# Patient Record
Sex: Female | Born: 1985 | Race: White | Hispanic: No | Marital: Married | Smoking: Never smoker
Health system: Southern US, Community
[De-identification: ages and names within clinical notes are randomized; demographics above are authoritative.]

## PROBLEM LIST (undated history)

## (undated) DIAGNOSIS — Z87442 Personal history of urinary calculi: Secondary | ICD-10-CM

## (undated) DIAGNOSIS — I10 Essential (primary) hypertension: Secondary | ICD-10-CM

## (undated) HISTORY — PX: DILATION AND CURETTAGE OF UTERUS: SHX78

## (undated) HISTORY — PX: LITHOTRIPSY: SUR834

---

## 2013-02-05 ENCOUNTER — Emergency Department (INDEPENDENT_AMBULATORY_CARE_PROVIDER_SITE_OTHER): Payer: BC Managed Care – PPO

## 2013-02-05 ENCOUNTER — Emergency Department
Admission: EM | Admit: 2013-02-05 | Discharge: 2013-02-05 | Disposition: A | Discharge status: Home / Self Care | Payer: BC Managed Care – PPO | Source: Home / Self Care | Attending: Family Medicine | Admitting: Family Medicine

## 2013-02-05 DIAGNOSIS — M549 Dorsalgia, unspecified: Secondary | ICD-10-CM

## 2013-02-05 DIAGNOSIS — R350 Frequency of micturition: Secondary | ICD-10-CM

## 2013-02-05 DIAGNOSIS — Z87442 Personal history of urinary calculi: Secondary | ICD-10-CM | POA: Insufficient documentation

## 2013-02-05 HISTORY — DX: Essential (primary) hypertension: I10

## 2013-02-05 HISTORY — DX: Personal history of urinary calculi: Z87.442

## 2013-02-05 LAB — POCT URINALYSIS DIP (MANUAL ENTRY)
Glucose, UA: NEGATIVE
Spec Grav, UA: 1.015 (ref 1.005–1.03)
Urobilinogen, UA: 0.2 (ref 0–1)
pH, UA: 6 (ref 5–8)

## 2013-02-05 MED ORDER — CYCLOBENZAPRINE HCL 10 MG PO TABS: 10.0000 mg | ORAL_TABLET | Freq: Three times a day (TID) | ORAL | Status: DC | PRN

## 2013-02-05 MED ORDER — KETOROLAC TROMETHAMINE 30 MG/ML IJ SOLN
30.0000 mg | Freq: Once | INTRAMUSCULAR | Status: AC
  Administered 2013-02-05: 30 mg via INTRAMUSCULAR

## 2013-02-05 MED ORDER — PREDNISONE 50 MG PO TABS: ORAL_TABLET | ORAL | Status: DC

## 2013-02-05 MED ORDER — FLUCONAZOLE 150 MG PO TABS: 150.0000 mg | ORAL_TABLET | Freq: Once | ORAL | Status: DC

## 2013-02-05 MED ORDER — CEPHALEXIN 500 MG PO CAPS: 500.0000 mg | ORAL_CAPSULE | Freq: Three times a day (TID) | ORAL | Status: DC

## 2013-02-05 NOTE — ED Provider Notes (Signed)
History    CSN: [id number] Arrival date & time 02/05/13  1305  First MD Initiated Contact with Patient 02/05/13 1338     Chief Complaint  Patient presents with  . Back Pain    x 1 week  . Urinary Frequency    couple weeks    Patient is a 27 y.o. female presenting with back pain and frequency.  Back Pain Location:  Lumbar spine Quality:  Aching and stabbing Radiates to: mild intermittent radiation into buttocks. Pain severity:  Moderate Worse during: intermittent. Worse with certain positions with sitting and standing. Duration:  2 weeks Timing:  Intermittent Progression:  Unchanged Chronicity:  Recurrent Context: physical stress   Relieved by:  Nothing Worsened by:  Ambulation and bending Associated symptoms comment:  Increased urinary frequency   Risk factors comment:  Prior hx/o kidney stones s/p ureteral stent placement and lithotripsy. Pt wants to rule out kidney stone as souce of sxs.  Urinary Frequency   Past Medical History  Diagnosis Date  . Hypertension   . History of kidney stones    Past Surgical History  Procedure Laterality Date  . Dilation and curettage of uterus     History reviewed. No pertinent family history. History  Substance Use Topics  . Smoking status: Never Smoker   . Smokeless tobacco: Never Used  . Alcohol Use: No   OB History   Grav Para Term Preterm Abortions TAB SAB Ect Mult Living                 Review of Systems  Genitourinary: Positive for frequency.  Musculoskeletal: Positive for back pain.  All other systems reviewed and are negative.    Allergies  Review of patient's allergies indicates no known allergies.  Home Medications  No current outpatient prescriptions on file. BP 127/79  Pulse 90  Temp(Src) 98.2 F (36.8 C) (Oral)  Ht 5\' 4"  (1.626 m)  Wt 159 lb (72.122 kg)  BMI 27.28 kg/m2  SpO2 99%  LMP 02/02/2013 Physical Exam  Constitutional: She appears well-developed and well-nourished.  HENT:  Head:  Normocephalic and atraumatic.  Eyes: Conjunctivae are normal. Pupils are equal, round, and reactive to light.  Neck: Normal range of motion.  Cardiovascular: Normal rate, regular rhythm and normal heart sounds.   Pulmonary/Chest: Effort normal.  Abdominal: Soft. Bowel sounds are normal.  Minimal suprapubic tenderness   Musculoskeletal:       Back:  Neurological: She is alert.  Skin: Skin is warm.    ED Course  Procedures (including critical care time) Labs Reviewed  POCT URINALYSIS DIP (MANUAL ENTRY) - Normal  URINE CULTURE    Dg Lumbar Spine Complete  02/05/2013   *RADIOLOGY REPORT*  Clinical Data: Back pain, urinary frequency  LUMBAR SPINE - COMPLETE 4+ VIEW  Comparison: None.  Findings: Five lumbar-type vertebral bodies.  Mild straightening of the lumbar spine.  No evidence of fracture or dislocation.  Vertebral body heights and intervertebral disc spaces are maintained.  Visualized bony pelvis appears intact.  IUD overlying the pelvis.  IMPRESSION: Normal lumbar spine radiographs.   Original Report Authenticated By: Charline Bills, M.D.   Dg Abd 1 View  02/05/2013   *RADIOLOGY REPORT*  Clinical Data: Back pain, urinary frequency  ABDOMEN - 1 VIEW  Comparison: None.  Findings: Nonobstructive bowel gas pattern.  IUD overlying the pelvis.  Visualized osseous structures are within normal limits.  IMPRESSION: Unremarkable abdominal radiograph.   Original Report Authenticated By: Charline Bills, M.D.   1.  Frequent urination   2. Back pain     MDM  DDx includes LS strain, UTI, kidney stones. L spine and KUB WNL.   Will place on keflex for UTI coverage.  Urine culture. Noted trace blood on UA, however pt menstruating.  Prednisone and flexeril for MSK component.  Discussed infectious, GU and MSK red flags.  Follow up as needed.     The patient and/or caregiver has been counseled thoroughly with regard to treatment plan and/or medications prescribed including dosage, schedule,  interactions, rationale for use, and possible side effects and they verbalize understanding. Diagnoses and expected course of recovery discussed and will return if not improved as expected or if the condition worsens. Patient and/or caregiver verbalized understanding.         Doree Albee, MD 02/05/13 1504

## 2013-02-05 NOTE — ED Notes (Signed)
Katie Rocha complains of low back pain for 1 week. She is also concerned about frequent urination for a couple of weeks.

## 2013-02-06 LAB — URINE CULTURE

## 2013-02-07 ENCOUNTER — Telehealth: Payer: Self-pay | Admitting: *Deleted

## 2013-09-12 ENCOUNTER — Telehealth: Payer: Self-pay | Admitting: *Deleted

## 2013-09-12 ENCOUNTER — Emergency Department (INDEPENDENT_AMBULATORY_CARE_PROVIDER_SITE_OTHER)
Admission: EM | Admit: 2013-09-12 | Discharge: 2013-09-12 | Disposition: A | Discharge status: Home / Self Care | Payer: BC Managed Care – PPO | Source: Home / Self Care | Attending: Family Medicine | Admitting: Family Medicine

## 2013-09-12 ENCOUNTER — Encounter: Payer: Self-pay | Admitting: Emergency Medicine

## 2013-09-12 DIAGNOSIS — M545 Low back pain, unspecified: Secondary | ICD-10-CM

## 2013-09-12 DIAGNOSIS — G8929 Other chronic pain: Secondary | ICD-10-CM

## 2013-09-12 DIAGNOSIS — Z87442 Personal history of urinary calculi: Secondary | ICD-10-CM

## 2013-09-12 LAB — POCT URINALYSIS DIP (MANUAL ENTRY)
Bilirubin, UA: NEGATIVE
Blood, UA: NEGATIVE
Glucose, UA: NEGATIVE
Ketones, POC UA: NEGATIVE
Leukocytes, UA: NEGATIVE
Nitrite, UA: NEGATIVE
PH UA: 7 (ref 5–8)
PROTEIN UA: NEGATIVE
SPEC GRAV UA: 1.015 (ref 1.005–1.03)
UROBILINOGEN UA: 0.2 (ref 0–1)

## 2013-09-12 MED ORDER — MELOXICAM 15 MG PO TABS: 15.0000 mg | ORAL_TABLET | Freq: Every day | ORAL | Status: DC

## 2013-09-12 MED ORDER — METAXALONE 800 MG PO TABS: ORAL_TABLET | ORAL | Status: DC

## 2013-09-12 NOTE — ED Notes (Signed)
Katie Rocha reports chronic lower back pain, 1 year, that has progressively become worse. Numbness spreads to her buttocks. She was unable to be seen by PCP today.

## 2013-09-12 NOTE — ED Notes (Signed)
Per Dr. Rolla PlateBeese's request, apt made for f/u with Dr. Benjamin Stainhekkekandam 09/19/13 @ 11:15am. Apt time and location given while in office.

## 2013-09-12 NOTE — Discharge Instructions (Signed)
Begin back exercises.   Chronic Back Pain  When back pain lasts longer than 3 months, it is called chronic back pain.People with chronic back pain often go through certain periods that are more intense (flare-ups).  CAUSES Chronic back pain can be caused by wear and tear (degeneration) on different structures in your back. These structures include:  The bones of your spine (vertebrae) and the joints surrounding your spinal cord and nerve roots (facets).  The strong, fibrous tissues that connect your vertebrae (ligaments). Degeneration of these structures may result in pressure on your nerves. This can lead to constant pain. HOME CARE INSTRUCTIONS  Avoid bending, heavy lifting, prolonged sitting, and activities which make the problem worse.  Take brief periods of rest throughout the day to reduce your pain. Lying down or standing usually is better than sitting while you are resting.  Take over-the-counter or prescription medicines only as directed by your caregiver. SEEK IMMEDIATE MEDICAL CARE IF:   You have weakness or numbness in one of your legs or feet.  You have trouble controlling your bladder or bowels.  You have nausea, vomiting, abdominal pain, shortness of breath, or fainting. Document Released: 08/14/2004 Document Revised: 09/29/2011 Document Reviewed: 06/21/2011 [facility] Patient Information 2014 [place], [place].

## 2013-09-12 NOTE — ED Provider Notes (Signed)
CSN: [id number]     Arrival date & time 09/12/13  1127 History   First MD Initiated Contact with Patient 09/12/13 1148     Chief Complaint  Patient presents with  . Back Pain       HPI Comments: Patient complains of chronic lower left back pain for at least a year.  Over the past week the pain has extended into her mid-lower back, worse at night. The pain is worse when bending over, and better supine in a fetal position.  The pain radiates to her buttocks at times, described as "numbness."  No bowel or bladder dysfunction.  No saddle numbness.  She has had poor response in the past from Naproxen and prednisone.   She had a normal lumbar spine x-ray last year.  Patient is a 28 y.o. female presenting with back pain. The history is provided by the patient.  Back Pain Location:  Sacro-iliac joint and lumbar spine Quality:  Aching Radiates to: radiates to buttocks. Pain severity:  Mild Pain is:  Worse during the night Onset quality:  Gradual Duration:  12 months Timing:  Constant Progression:  Unchanged Chronicity:  Chronic Relieved by:  Nothing Worsened by:  Bending Ineffective treatments:  NSAIDs Associated symptoms: tingling   Associated symptoms: no abdominal pain, no abdominal swelling, no bladder incontinence, no bowel incontinence, no chest pain, no dysuria, no fever, no headaches, no leg pain, no numbness, no paresthesias, no pelvic pain, no perianal numbness, no weakness and no weight loss     Past Medical History  Diagnosis Date  . Hypertension   . History of kidney stones    Past Surgical History  Procedure Laterality Date  . Dilation and curettage of uterus    . Lithotripsy     History reviewed. No pertinent family history. History  Substance Use Topics  . Smoking status: Never Smoker   . Smokeless tobacco: Never Used  . Alcohol Use: No   OB History   Grav Para Term Preterm Abortions TAB SAB Ect Mult Living                 Review of Systems  Constitutional:  Negative for fever and weight loss.  Cardiovascular: Negative for chest pain.  Gastrointestinal: Negative for abdominal pain and bowel incontinence.  Genitourinary: Negative for bladder incontinence, dysuria and pelvic pain.  Musculoskeletal: Positive for back pain.  Neurological: Positive for tingling. Negative for weakness, numbness, headaches and paresthesias.  All other systems reviewed and are negative.      Allergies  Review of patient's allergies indicates no known allergies.  Home Medications   Current Outpatient Rx  Name  Route  Sig  Dispense  Refill  . meloxicam (MOBIC) 15 MG tablet   Oral   Take 1 tablet (15 mg total) by mouth daily. Take with food each morning   15 tablet   0   . metaxalone (SKELAXIN) 800 MG tablet      Take one tab by mouth, one to three times daily   21 tablet   1    BP 148/87  Pulse 92  Resp 14  Wt 165 lb (74.844 kg)  SpO2 100%  LMP 08/29/2013 Physical Exam Nursing notes and Vital Signs reviewed. Appearance:  Patient appears healthy, stated age, and in no acute distress Eyes:  Pupils are equal, round, and reactive to light and accomodation.  Extraocular movement is intact.  Conjunctivae are not inflamed  Pharynx:  Normal Neck:  Supple.   No adenopathy  Lungs:  Clear to auscultation.  Breath sounds are equal.  Heart:  Regular rate and rhythm without murmurs, rubs, or gallops.  Abdomen:  Nontender without masses or hepatosplenomegaly.  Bowel sounds are present.  No CVA or flank tenderness.  Extremities:  No edema.  No calf tenderness Skin:  No rash present.  Back:  Good range of motion.  Can heel/toe walk and squat without difficulty. Tenderness over both SI joints, left worse than right, with tenderness extending into left buttock.  Straight leg raising test is negative.  Sitting knee extension test is negative.  Strength and sensation in the lower extremities is normal.  Patellar and achilles reflexes are normal.  Right leg approximately  1.5cm longer than left leg by inspection. No tenderness over hip greater trochanters.   ED Course  Procedures  none    Labs Reviewed  POCT URINALYSIS DIP (MANUAL ENTRY):  Negative         MDM   Final diagnoses:  Chronic low back pain  History of kidney stones    Begin Mobic and Skelaxin. Begin back exercises ([facility] information and instruction handout given)  Followup with Dr. Rodney Langtonhomas Thekkekandam in one week    Lattie HawStephen A Beese, MD 09/14/13 [id number]

## 2013-09-19 ENCOUNTER — Institutional Professional Consult (permissible substitution): Payer: BC Managed Care – PPO | Admitting: Sports Medicine

## 2013-09-22 ENCOUNTER — Encounter: Payer: Self-pay | Admitting: Sports Medicine

## 2013-09-22 ENCOUNTER — Ambulatory Visit (INDEPENDENT_AMBULATORY_CARE_PROVIDER_SITE_OTHER): Payer: BC Managed Care – PPO | Admitting: Sports Medicine

## 2013-09-22 VITALS — BP 124/78 | HR 72 | Ht 64.0 in | Wt 165.0 lb

## 2013-09-22 DIAGNOSIS — M5136 Other intervertebral disc degeneration, lumbar region: Secondary | ICD-10-CM | POA: Insufficient documentation

## 2013-09-22 DIAGNOSIS — M51369 Other intervertebral disc degeneration, lumbar region without mention of lumbar back pain or lower extremity pain: Secondary | ICD-10-CM | POA: Insufficient documentation

## 2013-09-22 DIAGNOSIS — M5137 Other intervertebral disc degeneration, lumbosacral region: Secondary | ICD-10-CM

## 2013-09-22 DIAGNOSIS — M51379 Other intervertebral disc degeneration, lumbosacral region without mention of lumbar back pain or lower extremity pain: Secondary | ICD-10-CM

## 2013-09-22 MED ORDER — TRAMADOL-ACETAMINOPHEN 37.5-325 MG PO TABS: 1.0000 | ORAL_TABLET | Freq: Four times a day (QID) | ORAL | Status: DC | PRN

## 2013-09-22 MED ORDER — PREDNISONE 50 MG PO TABS: ORAL_TABLET | ORAL | Status: DC

## 2013-09-22 NOTE — Progress Notes (Signed)
   Subjective:    I'm seeing this patient as a consultation for:  Dr. Cathren HarshBeese  CC: Low back pain  HPI: This is a very pleasant 28 year old female, for the past several months she's had pain that she localizes in her low back, worse with Valsalva, driving a car, and forward flexion. She denies any trauma, no bowel or bladder dysfunction or saddle numbness. Pain is moderate, persistent. She did have a CT scan which showed a L5-S1 disc protrusion. Symptoms are predominantly axial.  Past medical history, Surgical history, Family history not pertinant except as noted below, Social history, Allergies, and medications have been entered into the medical record, reviewed, and no changes needed.   Review of Systems: No headache, visual changes, nausea, vomiting, diarrhea, constipation, dizziness, abdominal pain, skin rash, fevers, chills, night sweats, weight loss, swollen lymph nodes, body aches, joint swelling, muscle aches, chest pain, shortness of breath, mood changes, visual or auditory hallucinations.   Objective:   General: Well Developed, well nourished, and in no acute distress.  Neuro/Psych: Alert and oriented x3, extra-ocular muscles intact, able to move all 4 extremities, sensation grossly intact. Skin: Warm and dry, no rashes noted.  Respiratory: Not using accessory muscles, speaking in full sentences, trachea midline.  Cardiovascular: Pulses palpable, no extremity edema. Abdomen: Does not appear distended. Back Exam:  Inspection: Unremarkable  Motion: Flexion 45 deg, Extension 45 deg, Side Bending to 45 deg bilaterally,  Rotation to 45 deg bilaterally  SLR laying: Negative  XSLR laying: Negative  Palpable tenderness: None. FABER: negative. Sensory change: Gross sensation intact to all lumbar and sacral dermatomes.  Reflexes: 2+ at both patellar tendons, 2+ at achilles tendons, Babinski's downgoing.  Strength at foot  Plantar-flexion: 5/5 Dorsi-flexion: 5/5 Eversion: 5/5 Inversion:  5/5  Leg strength  Quad: 5/5 Hamstring: 5/5 Hip flexor: 5/5 Hip abductors: 5/5  Gait unremarkable.  CT scan does show a L5-S1 disc protrusion.  Impression and Recommendations:   This case required medical decision making of moderate complexity.

## 2013-09-22 NOTE — Assessment & Plan Note (Signed)
Back pain is predominantly axial, CT scan for trauma did show an L5-S1 disc occlusion. At this we are going to proceed with an additional 5 days of prednisone, Ultracet, and formal physical therapy. Return to see me in one month, if no we will obtain an MRI for interventional injection planning.

## 2013-10-03 ENCOUNTER — Other Ambulatory Visit: Payer: Self-pay | Admitting: Sports Medicine

## 2013-10-03 MED ORDER — MELOXICAM 15 MG PO TABS: ORAL_TABLET | ORAL | Status: DC

## 2013-12-21 ENCOUNTER — Ambulatory Visit (INDEPENDENT_AMBULATORY_CARE_PROVIDER_SITE_OTHER): Payer: BC Managed Care – PPO | Admitting: Sports Medicine

## 2013-12-21 ENCOUNTER — Ambulatory Visit (INDEPENDENT_AMBULATORY_CARE_PROVIDER_SITE_OTHER): Payer: BC Managed Care – PPO

## 2013-12-21 ENCOUNTER — Encounter: Payer: Self-pay | Admitting: Sports Medicine

## 2013-12-21 VITALS — BP 127/81 | HR 85

## 2013-12-21 DIAGNOSIS — M7989 Other specified soft tissue disorders: Secondary | ICD-10-CM

## 2013-12-21 DIAGNOSIS — X58XXXA Exposure to other specified factors, initial encounter: Secondary | ICD-10-CM

## 2013-12-21 DIAGNOSIS — S92309A Fracture of unspecified metatarsal bone(s), unspecified foot, initial encounter for closed fracture: Secondary | ICD-10-CM

## 2013-12-21 DIAGNOSIS — M5136 Other intervertebral disc degeneration, lumbar region: Secondary | ICD-10-CM

## 2013-12-21 DIAGNOSIS — M51369 Other intervertebral disc degeneration, lumbar region without mention of lumbar back pain or lower extremity pain: Secondary | ICD-10-CM

## 2013-12-21 DIAGNOSIS — M79671 Pain in right foot: Secondary | ICD-10-CM

## 2013-12-21 DIAGNOSIS — S92353A Displaced fracture of fifth metatarsal bone, unspecified foot, initial encounter for closed fracture: Secondary | ICD-10-CM | POA: Insufficient documentation

## 2013-12-21 MED ORDER — HYDROCODONE-ACETAMINOPHEN 5-325 MG PO TABS: 1.0000 | ORAL_TABLET | Freq: Three times a day (TID) | ORAL | Status: DC | PRN

## 2013-12-21 NOTE — Assessment & Plan Note (Addendum)
Palpation over the dorsum of the fourth and fifth metatarsal shafts with bruising suggestive of a fracture. Cast boot, hydrocodone, x-rays. Return in 2 weeks. Xray before visit  I billed a fracture code for this visit, all subsequent visits for this complaint will be "post-op checks" in the global period.

## 2013-12-21 NOTE — Progress Notes (Signed)
  Subjective:    CC: Right foot pain  HPI: This is a pleasant 28 year old female who recently inverted her foot, she has pain, with swelling and bruising over the dorsum of the fourth and fifth metatarsals. Pain is severe, persistent.  Past medical history, Surgical history, Family history not pertinant except as noted below, Social history, Allergies, and medications have been entered into the medical record, reviewed, and no changes needed.   Review of Systems: No fevers, chills, night sweats, weight loss, chest pain, or shortness of breath.   Objective:    General: Well Developed, well nourished, and in no acute distress.  Neuro: Alert and oriented x3, extra-ocular muscles intact, sensation grossly intact.  HEENT: Normocephalic, atraumatic, pupils equal round reactive to light, neck supple, no masses, no lymphadenopathy, thyroid nonpalpable.  Skin: Warm and dry, no rashes. Cardiac: Regular rate and rhythm, no murmurs rubs or gallops, no lower extremity edema.  Respiratory: Clear to auscultation bilaterally. Not using accessory muscles, speaking in full sentences. Left Foot: Significantly bruised and swollen. Range of motion is full in all directions. Strength is 5/5 in all directions. No hallux valgus. No pes cavus or pes planus. No abnormal callus noted. No pain over the navicular prominence, or base of fifth metatarsal. No tenderness to palpation of the calcaneal insertion of plantar fascia. No pain at the Achilles insertion. No pain over the calcaneal bursa. No pain of the retrocalcaneal bursa. Tenderness to palpation over the dorsum of the fourth and fifth metatarsal shafts. No hallux rigidus or limitus. No tenderness palpation over interphalangeal joints. No pain with compression of the metatarsal heads. Neurovascularly intact distally.  X-rays reviewed, there is oblique fracture through the neck of fifth metatarsal.  Impression and Recommendations:

## 2013-12-21 NOTE — Addendum Note (Signed)
Addended by: Monica Becton on: 12/21/2013 12:19 PM   Modules accepted: Orders

## 2013-12-21 NOTE — Assessment & Plan Note (Signed)
Resolved with prednisone. 

## 2014-01-03 ENCOUNTER — Encounter: Payer: Self-pay | Admitting: Sports Medicine

## 2014-01-03 ENCOUNTER — Ambulatory Visit (INDEPENDENT_AMBULATORY_CARE_PROVIDER_SITE_OTHER): Payer: BC Managed Care – PPO | Admitting: Sports Medicine

## 2014-01-03 ENCOUNTER — Ambulatory Visit (INDEPENDENT_AMBULATORY_CARE_PROVIDER_SITE_OTHER): Payer: BC Managed Care – PPO

## 2014-01-03 VITALS — BP 111/76 | HR 76 | Ht 64.0 in | Wt 163.0 lb

## 2014-01-03 DIAGNOSIS — S92353A Displaced fracture of fifth metatarsal bone, unspecified foot, initial encounter for closed fracture: Secondary | ICD-10-CM

## 2014-01-03 DIAGNOSIS — IMO0001 Reserved for inherently not codable concepts without codable children: Secondary | ICD-10-CM

## 2014-01-03 DIAGNOSIS — S92309A Fracture of unspecified metatarsal bone(s), unspecified foot, initial encounter for closed fracture: Secondary | ICD-10-CM

## 2014-01-03 DIAGNOSIS — M79671 Pain in right foot: Secondary | ICD-10-CM

## 2014-01-03 MED ORDER — HYDROCODONE-ACETAMINOPHEN 5-325 MG PO TABS: 1.0000 | ORAL_TABLET | Freq: Three times a day (TID) | ORAL | Status: DC | PRN

## 2014-01-03 NOTE — Progress Notes (Signed)
  Subjective: 2 weeks post spiral fracture through the neck of the right fifth metatarsal, doing well in cast boot   Objective: General: Well-developed, well-nourished, and in no acute distress. Right foot: Still tender to palpation of the fracture. Neurovascularly intact distally.  X-rays reviewed, shows stability of the spiral fracture.  Assessment/plan:

## 2014-01-03 NOTE — Assessment & Plan Note (Signed)
Continue boot for another 2 weeks. X-rays at the next visit. Refilling hydrocodone. Strap with compressive dressing.

## 2014-01-17 ENCOUNTER — Encounter: Payer: Self-pay | Admitting: Sports Medicine

## 2014-01-17 ENCOUNTER — Ambulatory Visit (INDEPENDENT_AMBULATORY_CARE_PROVIDER_SITE_OTHER): Payer: BC Managed Care – PPO | Admitting: Sports Medicine

## 2014-01-17 ENCOUNTER — Ambulatory Visit (INDEPENDENT_AMBULATORY_CARE_PROVIDER_SITE_OTHER): Payer: BC Managed Care – PPO

## 2014-01-17 VITALS — BP 123/82 | HR 80 | Ht 64.0 in | Wt 163.0 lb

## 2014-01-17 DIAGNOSIS — IMO0001 Reserved for inherently not codable concepts without codable children: Secondary | ICD-10-CM

## 2014-01-17 DIAGNOSIS — S8290XD Unspecified fracture of unspecified lower leg, subsequent encounter for closed fracture with routine healing: Secondary | ICD-10-CM

## 2014-01-17 DIAGNOSIS — S92351D Displaced fracture of fifth metatarsal bone, right foot, subsequent encounter for fracture with routine healing: Secondary | ICD-10-CM

## 2014-01-17 DIAGNOSIS — S92353A Displaced fracture of fifth metatarsal bone, unspecified foot, initial encounter for closed fracture: Secondary | ICD-10-CM

## 2014-01-17 MED ORDER — HYDROCODONE-ACETAMINOPHEN 5-325 MG PO TABS: 1.0000 | ORAL_TABLET | Freq: Three times a day (TID) | ORAL | Status: DC | PRN

## 2014-01-17 NOTE — Assessment & Plan Note (Signed)
Continue Cam Boot for an additional week, transition into a regular shoe afterwards. Return to see me after that.

## 2014-01-17 NOTE — Progress Notes (Signed)
  Subjective: 4 weeks post viral fracture through the neck of the fifth metatarsal, doing well.   Objective: General: Well-developed, well-nourished, and in no acute distress. Right foot: Only mild tenderness to palpation of the fracture site. Neurovascularly intact distally.  Was again strapped with compressive dressing.  X-ray showed stability of the fracture with a small amount of bone callus formation suggesting early healing.  Assessment/plan:

## 2014-01-31 ENCOUNTER — Ambulatory Visit: Payer: BC Managed Care – PPO | Admitting: Sports Medicine

## 2014-05-17 ENCOUNTER — Ambulatory Visit (INDEPENDENT_AMBULATORY_CARE_PROVIDER_SITE_OTHER): Payer: BC Managed Care – PPO | Admitting: Physician Assistant

## 2014-05-17 ENCOUNTER — Encounter (INDEPENDENT_AMBULATORY_CARE_PROVIDER_SITE_OTHER): Payer: Self-pay

## 2014-05-17 ENCOUNTER — Encounter: Payer: Self-pay | Admitting: Physician Assistant

## 2014-05-17 VITALS — BP 132/90 | HR 86 | Ht 64.0 in | Wt 171.0 lb

## 2014-05-17 DIAGNOSIS — R350 Frequency of micturition: Secondary | ICD-10-CM | POA: Diagnosis not present

## 2014-05-17 DIAGNOSIS — F411 Generalized anxiety disorder: Secondary | ICD-10-CM | POA: Diagnosis not present

## 2014-05-17 DIAGNOSIS — R5382 Chronic fatigue, unspecified: Secondary | ICD-10-CM

## 2014-05-17 DIAGNOSIS — R631 Polydipsia: Secondary | ICD-10-CM

## 2014-05-17 DIAGNOSIS — R110 Nausea: Secondary | ICD-10-CM | POA: Diagnosis not present

## 2014-05-17 DIAGNOSIS — R002 Palpitations: Secondary | ICD-10-CM

## 2014-05-17 LAB — POCT URINALYSIS DIPSTICK
Bilirubin, UA: NEGATIVE
Glucose, UA: NEGATIVE
KETONES UA: NEGATIVE
LEUKOCYTES UA: NEGATIVE
Nitrite, UA: NEGATIVE
PH UA: 7
PROTEIN UA: NEGATIVE
RBC UA: NEGATIVE
Spec Grav, UA: 1.01
Urobilinogen, UA: 0.2

## 2014-05-17 LAB — POCT GLYCOSYLATED HEMOGLOBIN (HGB A1C): Hemoglobin A1C: 5.1

## 2014-05-17 LAB — POCT URINE PREGNANCY: Preg Test, Ur: NEGATIVE

## 2014-05-17 MED ORDER — CITALOPRAM HYDROBROMIDE 10 MG PO TABS: 10.0000 mg | ORAL_TABLET | Freq: Every day | ORAL | Status: DC

## 2014-05-17 MED ORDER — ALPRAZOLAM 0.5 MG PO TABS: 0.5000 mg | ORAL_TABLET | Freq: Two times a day (BID) | ORAL | Status: DC | PRN

## 2014-05-17 MED ORDER — AZELASTINE HCL 0.05 % OP SOLN: 1.0000 [drp] | Freq: Two times a day (BID) | OPHTHALMIC | Status: DC

## 2014-05-17 NOTE — Patient Instructions (Addendum)
Claritin daily for allergies.    Premature Ventricular Contraction Premature ventricular contraction (PVC) is an irregularity of the heart rhythm involving extra or skipped heartbeats. In some cases, they may occur without obvious cause or heart disease. Other times, they can be caused by an electrolyte change in the blood. These need to be corrected. They can also be seen when there is not enough oxygen going to the heart. A common cause of this is plaque or cholesterol buildup. This buildup decreases the blood supply to the heart. In addition, extra beats may be caused or aggravated by:  Excessive smoking.  Alcohol consumption.  Caffeine.  Certain medications  Some street drugs. SYMPTOMS   The sensation of feeling your heart skipping a beat (palpitations).  In many cases, the person may have no symptoms. SIGNS AND TESTS   A physical examination may show an occasional irregularity, but if the PVC beats do not happen often, they may not be found on physical exam.  Blood pressure is usually normal.  Other tests that may find extra beats of the heart are:  An EKG (electrocardiogram)  A Holter monitor which can monitor your heart over longer periods of time  An Angiogram (study of the heart arteries). TREATMENT  Usually extra heartbeats do not need treatment. The condition is treated only if symptoms are severe or if extra beats are very frequent or are causing problems. An underlying cause, if discovered, may also require treatment.  Treatment may also be needed if there may be a risk for other more serious cardiac arrhythmias.  PREVENTION   Moderation in caffeine, alcohol, and tobacco use may reduce the risk of ectopic heartbeats in some people.  Exercise often helps people who lead a sedentary (inactive) lifestyle. PROGNOSIS  PVC heartbeats are generally harmless and do not need treatment.  RISKS AND COMPLICATIONS   Ventricular tachycardia (occasionally).  There  usually are no complications.  Other arrhythmias (occasionally). SEEK IMMEDIATE MEDICAL CARE IF:   You feel palpitations that are frequent or continual.  You develop chest pain or other problems such as shortness of breath, sweating, or nausea and vomiting.  You become light-headed or faint (pass out).  You get worse or do not improve with treatment. Document Released: 02/22/2004 Document Revised: 09/29/2011 Document Reviewed: 09/03/2007 [facility] Patient Information 2015 [place]Care, [place]. This information is not intended to replace advice given to you by your health care provider. Make sure you discuss any questions you have with your health care provider.

## 2014-05-17 NOTE — Progress Notes (Signed)
Subjective:    Patient ID: Katie Rocha, female    DOB: 17-Sep-1985, 28 y.o.   MRN: [id number]  HPI  Pt is a 28 yo female who presents to the clinic to establish care.   .. Active Ambulatory Problems    Diagnosis Date Noted  . History of kidney stones   . Lumbar degenerative disc disease 09/22/2013  . Closed fracture of right fifth metatarsal bone 12/21/2013   Resolved Ambulatory Problems    Diagnosis Date Noted  . No Resolved Ambulatory Problems   Past Medical History  Diagnosis Date  . Hypertension    .Marland Kitchen. Family History  Problem Relation Age of Onset  . Hypertension Father   . Hyperlipidemia Maternal Grandmother   . Hyperlipidemia Maternal Grandfather    .Marland Kitchen. History   Social History  . Marital Status: Married    Spouse Name: N/A    Number of Children: N/A  . Years of Education: N/A   Occupational History  . Not on file.   Social History Main Topics  . Smoking status: Never Smoker   . Smokeless tobacco: Never Used  . Alcohol Use: Yes  . Drug Use: No  . Sexual Activity: Yes   Other Topics Concern  . Not on file   Social History Narrative   She comes in today with numerous symptoms over the past month or so. She is currently no on any prescription medications but taking MVI and Fish oils 4 days ago. She has been experiencing more palpitations recently every day but worse at night. Denies any CP. She drinks some caffiene but stopped 2 weeks ago to see if it would help. It has not. She is having occasionaly nausea but not vomiting. Has IUD. Denies any alcohol usage. Concerned about hypoglycemia or diabetes. She does feel thirsty all the time and goes to the bathroom with increased frequency. Denies any dysuria. She feels dizzy a lot. She is very fatigued. Some times her vision is blurry but usually when her eyes are watery and itchy. She admits to allergies. She is gaining weight. She does admit to being very stressed. Has taken xanax in the past and has helped acute  anxiety.        Review of Systems  All other systems reviewed and are negative.      Objective:   Physical Exam  Constitutional: She is oriented to person, place, and time. She appears well-developed and well-nourished.  HENT:  Head: Normocephalic and atraumatic.  Eyes:  Conjunctiva injected bilaterally with watery discharge.   Cardiovascular: Normal rate, regular rhythm and normal heart sounds.   Pulmonary/Chest: Effort normal and breath sounds normal. She has no wheezes.  No CVA tenderness.   Abdominal: Soft. Bowel sounds are normal. She exhibits no distension and no mass. There is no tenderness. There is no rebound and no guarding.  Neurological: She is alert and oriented to person, place, and time.  Psychiatric: She has a normal mood and affect. Her behavior is normal.          Assessment & Plan:  Nausea- UPT negative. Unexplained currently. Could be anxiety.   Urinary frequency/excessive thirst- UA dipstick was negative. A1C was 5.1. Pt does not have diabetes or infection. Discussed hypoglycemic events could still be occuring. Eating small frequent meals could help with any blood sugar lows.   Fatigue/palpitiations-  EKG NSR at 81. No ST elevation or depression. No arrhythmias. No PVC's. Discussed just because we did not catch a PVC does not mean  they are not happening. Gave pt HO on PVC and some common triggers. Keep diary when at there worse and follow up in 1 month. If not improving can get holter monitor for futher evaluation.  Lab work ordered for fatigue- CBC, TSH, iron levels, vitamin D and b12.   Anxiety- it is fairly evident by todays encounter that pt is very anxious. I would like for her to start celexa at bedtime and xanax just as needed up to twice a day for anxious attacks. Discussed xanax and abuse potential.SE of celexa discussed. If symptoms get worse call office. If not follow up in 4 weeks. Can take time for SSRI to work.

## 2014-05-19 ENCOUNTER — Ambulatory Visit (INDEPENDENT_AMBULATORY_CARE_PROVIDER_SITE_OTHER): Payer: BC Managed Care – PPO | Admitting: Physician Assistant

## 2014-05-19 ENCOUNTER — Other Ambulatory Visit: Payer: Self-pay | Admitting: Physician Assistant

## 2014-05-19 VITALS — BP 129/78 | HR 93 | Wt 171.0 lb

## 2014-05-19 DIAGNOSIS — Z136 Encounter for screening for cardiovascular disorders: Secondary | ICD-10-CM

## 2014-05-19 DIAGNOSIS — R030 Elevated blood-pressure reading, without diagnosis of hypertension: Secondary | ICD-10-CM | POA: Diagnosis not present

## 2014-05-19 DIAGNOSIS — Z0130 Encounter for examination of blood pressure without abnormal findings: Secondary | ICD-10-CM

## 2014-05-19 LAB — CBC WITH DIFFERENTIAL/PLATELET
Basophils Absolute: 0 10*3/uL (ref 0.0–0.1)
Basophils Relative: 0 % (ref 0–1)
EOS PCT: 1 % (ref 0–5)
Eosinophils Absolute: 0.1 10*3/uL (ref 0.0–0.7)
HEMATOCRIT: 42 % (ref 36.0–46.0)
HEMOGLOBIN: 14.6 g/dL (ref 12.0–15.0)
LYMPHS ABS: 1.9 10*3/uL (ref 0.7–4.0)
LYMPHS PCT: 21 % (ref 12–46)
MCH: 28.3 pg (ref 26.0–34.0)
MCHC: 34.8 g/dL (ref 30.0–36.0)
MCV: 81.6 fL (ref 78.0–100.0)
MONO ABS: 0.6 10*3/uL (ref 0.1–1.0)
MONOS PCT: 7 % (ref 3–12)
NEUTROS ABS: 6.5 10*3/uL (ref 1.7–7.7)
Neutrophils Relative %: 71 % (ref 43–77)
Platelets: 341 10*3/uL (ref 150–400)
RBC: 5.15 MIL/uL — AB (ref 3.87–5.11)
RDW: 12.8 % (ref 11.5–15.5)
WBC: 9.1 10*3/uL (ref 4.0–10.5)

## 2014-05-19 NOTE — Progress Notes (Addendum)
   Subjective:    Patient ID: Katie Rocha, female    DOB: 1985/09/14, 28 y.o.   MRN: [id number]  HPI  Wyatt Mageabitha is here for a blood pressure check. She reports elevated blood pressure last night at work. Right arm 157/94 and 154/96 Left arm 164/83. Today here blood pressure in our office is Left arm 129/78 and Right arm 119/73. She believes her blood pressure is the cause of her feeling bad. She has been on Atenolol in the past and thinks she should go back on the blood pressure medication.       Review of Systems     Objective:   Physical Exam        Assessment & Plan:  Call pt: certainly keep log of BP readings for next 2 weeks. There are many causes of BP elevation such as anxiety, increased intake of salt etc. Follow up in 2 weeks and can view log as well as symptoms. No reason today to start any BP medications. Tandy GawJade Breeback PA-C  Called patient: left detailed message advising patient to schedule a follow up in two weeks. Also to check and record blood pressure for the next two weeks.

## 2014-05-20 LAB — COMPLETE METABOLIC PANEL WITH GFR
ALBUMIN: 4.7 g/dL (ref 3.5–5.2)
ALT: 29 U/L (ref 0–35)
AST: 21 U/L (ref 0–37)
Alkaline Phosphatase: 52 U/L (ref 39–117)
BUN: 10 mg/dL (ref 6–23)
CALCIUM: 9.3 mg/dL (ref 8.4–10.5)
CHLORIDE: 105 meq/L (ref 96–112)
CO2: 21 meq/L (ref 19–32)
Creat: 0.63 mg/dL (ref 0.50–1.10)
GFR, Est African American: >89 mL/min
GLUCOSE: 82 mg/dL (ref 70–99)
POTASSIUM: 4.3 meq/L (ref 3.5–5.3)
SODIUM: 137 meq/L (ref 135–145)
TOTAL PROTEIN: 6.9 g/dL (ref 6.0–8.3)
Total Bilirubin: 0.7 mg/dL (ref 0.2–1.2)

## 2014-05-20 LAB — TSH: TSH: 1.748 u[IU]/mL (ref 0.350–4.500)

## 2014-05-20 LAB — VITAMIN D 25 HYDROXY (VIT D DEFICIENCY, FRACTURES): VIT D 25 HYDROXY: 36 ng/mL (ref 30–89)

## 2014-05-20 LAB — IRON AND TIBC
%SAT: 41 % (ref 20–55)
Iron: 164 ug/dL — ABNORMAL HIGH (ref 42–145)
TIBC: 401 ug/dL (ref 250–470)
UIBC: 237 ug/dL (ref 125–400)

## 2014-05-20 LAB — VITAMIN B12: Vitamin B-12: 441 pg/mL (ref 211–911)

## 2014-05-20 LAB — FERRITIN: Ferritin: 47 ng/mL (ref 10–291)

## 2014-05-21 DIAGNOSIS — F411 Generalized anxiety disorder: Secondary | ICD-10-CM | POA: Insufficient documentation

## 2014-05-21 DIAGNOSIS — R5382 Chronic fatigue, unspecified: Secondary | ICD-10-CM | POA: Insufficient documentation

## 2014-05-21 DIAGNOSIS — R002 Palpitations: Secondary | ICD-10-CM | POA: Insufficient documentation

## 2014-06-03 NOTE — Addendum Note (Signed)
Addended by: Chalmers CaterUTTLE, Alitzel Cookson H on: 06/03/2014 09:55 AM   Modules accepted: Orders

## 2014-06-23 ENCOUNTER — Other Ambulatory Visit: Payer: Self-pay | Admitting: Sports Medicine

## 2014-09-20 ENCOUNTER — Other Ambulatory Visit: Payer: Self-pay | Admitting: Physician Assistant

## 2014-09-20 ENCOUNTER — Ambulatory Visit (INDEPENDENT_AMBULATORY_CARE_PROVIDER_SITE_OTHER): Payer: BLUE CROSS/BLUE SHIELD | Admitting: Physician Assistant

## 2014-09-20 ENCOUNTER — Encounter: Payer: Self-pay | Admitting: Physician Assistant

## 2014-09-20 VITALS — BP 156/88 | HR 113 | Ht 64.0 in | Wt 173.0 lb

## 2014-09-20 DIAGNOSIS — F411 Generalized anxiety disorder: Secondary | ICD-10-CM

## 2014-09-20 DIAGNOSIS — F430 Acute stress reaction: Principal | ICD-10-CM

## 2014-09-20 DIAGNOSIS — F419 Anxiety disorder, unspecified: Secondary | ICD-10-CM

## 2014-09-20 MED ORDER — CITALOPRAM HYDROBROMIDE 20 MG PO TABS: 20.0000 mg | ORAL_TABLET | Freq: Every day | ORAL | Status: DC

## 2014-09-20 MED ORDER — ALPRAZOLAM 0.5 MG PO TABS: 0.5000 mg | ORAL_TABLET | Freq: Three times a day (TID) | ORAL | Status: DC | PRN

## 2014-09-20 NOTE — Progress Notes (Signed)
   Subjective:    Patient ID: Katie Rocha, female    DOB: Mar 30, 1986, 29 y.o.   MRN: [id number]  HPI  Patient is a 29 year old female who presents to the clinic with acute anxiety. She just found out on Friday that her husband has been cheating on her. She found pictures and text messages. She is extremely devastated. They have 3 his together. She is struggling to know what to do. She went to work yesterday and felt like she was going to have a breakdown. She did take some of the maxilla was previously given to her and it did help. She had been seen back in October for mild anxiety. She was given Celexa but never started it. She decided to start yesterday. She denies any suicidal or homicidal thoughts. She is currently not in counseling.   Review of Systems  All other systems reviewed and are negative.      Objective:   Physical Exam  Constitutional: She is oriented to person, place, and time. She appears well-developed and well-nourished.  HENT:  Head: Normocephalic and atraumatic.  Pulmonary/Chest: Effort normal and breath sounds normal. She has no wheezes.  Neurological: She is alert and oriented to person, place, and time.  Psychiatric:  Crying throughout encounter today.          Assessment & Plan:  Anxiety it as Q reaction to exceptional stress- GAD -7 was 21. discuss with patient that were more related. Discussed with patient how Celexa is not at its therapeutic dose in one day. Will take time to get her system. Discussed with patient could increase celexa to 20mg  after 7 days. I did give new rx of 20mg  daily. Increased xanax to .5mg  tid for short term. Discussed abuse potential and how needed to decreased after a month or so. I did encourage patient that we needed to proceed with counseling. She has an appointment downstairs for tomorrow. She is concerned about cost. Her insurance does not cover counseling. I offered her alternatives with some of the local churches in  counseling. She is interested. I do think this would help her. I did write her out of work for the next week while the Celexa gets in her system as well as she calms down and make some decisions.  Spent 30 minutes with patient greater than 50% of the visit spent counseling patient regarding acute stressful situation.

## 2014-09-21 ENCOUNTER — Ambulatory Visit (HOSPITAL_COMMUNITY): Payer: Self-pay | Admitting: Physician Assistant

## 2014-10-04 ENCOUNTER — Telehealth: Payer: Self-pay

## 2014-10-04 MED ORDER — SERTRALINE HCL 50 MG PO TABS: 50.0000 mg | ORAL_TABLET | Freq: Every day | ORAL | Status: DC

## 2014-10-04 NOTE — Telephone Encounter (Signed)
Katie Rocha states she may have side effects from the Celexa. She reports from the first day of taking the Celexa she noticed nausea, stomach burning and diarrhea. She did take Pepto-Bismol for the symptoms with a little relief of the burning but no relief from the diarrhea. She is happy to report some improvement in her anxiety/depression. She tested positive for chlamydia and was treated. She has an appointment today with her GYN for further testing. She wanted to know if she should start a PPI or H2 blocker for her symptoms or change medication altogether. Please advise. She is aware she will need to follow up in the near future.

## 2014-10-04 NOTE — Telephone Encounter (Signed)
Contacted Patient, advised to stop taking the Celexa Rx and we will call in a new Rx for Zoloft 50mg . Patient verbalized understanding. Sent Rx to patient preferred pharmacy. No further questions.  FYI: Patient wanted Katie Rocha to know she has an appointment tomorrow (10/05/2014) at [facility].

## 2014-10-04 NOTE — Telephone Encounter (Signed)
Let's try switching medication first. If side effect do not want to mask with other medications. Let's try zoloft 50mg  daily and stop celexa 20mg . Please add celexa to intolerances with GI side effects. Give one month but needs a follow up in next 2 weeks after starting.

## 2014-10-18 ENCOUNTER — Encounter: Payer: Self-pay | Admitting: Physician Assistant

## 2014-10-18 ENCOUNTER — Ambulatory Visit (INDEPENDENT_AMBULATORY_CARE_PROVIDER_SITE_OTHER): Payer: BLUE CROSS/BLUE SHIELD | Admitting: Physician Assistant

## 2014-10-18 VITALS — BP 136/89 | HR 86 | Wt 170.0 lb

## 2014-10-18 DIAGNOSIS — F419 Anxiety disorder, unspecified: Secondary | ICD-10-CM

## 2014-10-18 DIAGNOSIS — F329 Major depressive disorder, single episode, unspecified: Secondary | ICD-10-CM | POA: Diagnosis not present

## 2014-10-18 DIAGNOSIS — F32A Depression, unspecified: Secondary | ICD-10-CM | POA: Insufficient documentation

## 2014-10-18 DIAGNOSIS — F411 Generalized anxiety disorder: Secondary | ICD-10-CM

## 2014-10-18 DIAGNOSIS — F430 Acute stress reaction: Principal | ICD-10-CM

## 2014-10-18 MED ORDER — ALPRAZOLAM 0.5 MG PO TABS: 0.5000 mg | ORAL_TABLET | Freq: Three times a day (TID) | ORAL | Status: DC | PRN

## 2014-10-18 MED ORDER — ESCITALOPRAM OXALATE 10 MG PO TABS: 10.0000 mg | ORAL_TABLET | Freq: Every day | ORAL | Status: DC

## 2014-10-18 MED ORDER — BUSPIRONE HCL 5 MG PO TABS: 5.0000 mg | ORAL_TABLET | Freq: Three times a day (TID) | ORAL | Status: DC

## 2014-10-18 NOTE — Patient Instructions (Signed)
lexapro daily.  buspar twice a day up to three times.  xanax

## 2014-10-18 NOTE — Progress Notes (Signed)
   Subjective:    Patient ID: Katie Rocha, female    DOB: August 12, 1985, 29 y.o.   MRN: [id number]  HPI Pt presents to the clinic for acute anxiety follow up. Approximately 1 month ago her husband cheated on her. We started her on celexa. Within first week she had benefit but had lots of diarrhea and nausea. She was also on abx for STD however diarrhea was always after taking celexa. Xanax helps but having to take up to three times a day. We called in zoloft and been on for 1 week with absolutely no benefit. She is very tearful, anxious, worrying. She gets so anxious that she has vomited in the past. She thought her home life was better before stopping celexa. Now she feels like she has no tolerance for anyone. No suicidal or homicidal thoughts. She is going to counseling at [facility] which she feels is helping some.    Review of Systems  All other systems reviewed and are negative.      Objective:   Physical Exam  Constitutional: She is oriented to person, place, and time. She appears well-developed and well-nourished.  HENT:  Head: Normocephalic and atraumatic.  Cardiovascular: Normal rate, regular rhythm and normal heart sounds.   Pulmonary/Chest: Breath sounds normal.  Neurological: She is alert and oriented to person, place, and time.  Skin: Skin is dry.  Psychiatric: Her behavior is normal.  Very tearful.           Assessment & Plan:  Anxiety/depression- GAD-7 was 21. PHQ-9 was 20. She feels like numbers were much better with celexa. Discussed not on zoloft to really know how would react but since no improvement will change today. Stop zoloft. continue counseling. Refilled xanax but discussed to only use as needed and to try to cut back. Will start lexapro and see if she tolerates better. Discussed option of viibryd but she feels like she tried lexapro in the past and worked well. Will also add buspar bid up to tid. Follow up in one month.

## 2014-11-18 ENCOUNTER — Other Ambulatory Visit: Payer: Self-pay | Admitting: Physician Assistant

## 2014-12-20 ENCOUNTER — Other Ambulatory Visit: Payer: Self-pay | Admitting: Physician Assistant

## 2014-12-26 ENCOUNTER — Other Ambulatory Visit: Payer: Self-pay | Admitting: Family Medicine

## 2014-12-26 MED ORDER — ESCITALOPRAM OXALATE 10 MG PO TABS: 10.0000 mg | ORAL_TABLET | Freq: Every day | ORAL | Status: DC

## 2014-12-26 NOTE — Telephone Encounter (Signed)
Patient called in tears stating she needs a refill on her lexapro. Advised we need to set her up for an appt, added to Dr. Linford ArnoldMetheney schedule since Pt prefers a female provider. Will send over 2 week supply to last until f/u.

## 2015-01-05 ENCOUNTER — Ambulatory Visit (INDEPENDENT_AMBULATORY_CARE_PROVIDER_SITE_OTHER): Payer: BLUE CROSS/BLUE SHIELD | Admitting: Family Medicine

## 2015-01-05 ENCOUNTER — Encounter: Payer: Self-pay | Admitting: Family Medicine

## 2015-01-05 VITALS — BP 123/78 | HR 93 | Wt 170.0 lb

## 2015-01-05 DIAGNOSIS — F329 Major depressive disorder, single episode, unspecified: Secondary | ICD-10-CM | POA: Diagnosis not present

## 2015-01-05 DIAGNOSIS — F411 Generalized anxiety disorder: Secondary | ICD-10-CM | POA: Diagnosis not present

## 2015-01-05 DIAGNOSIS — M545 Low back pain, unspecified: Secondary | ICD-10-CM

## 2015-01-05 DIAGNOSIS — F32A Depression, unspecified: Secondary | ICD-10-CM

## 2015-01-05 MED ORDER — PREDNISONE 20 MG PO TABS: 40.0000 mg | ORAL_TABLET | Freq: Every day | ORAL | Status: DC

## 2015-01-05 MED ORDER — ESCITALOPRAM OXALATE 20 MG PO TABS: 20.0000 mg | ORAL_TABLET | Freq: Every day | ORAL | Status: DC

## 2015-01-05 NOTE — Progress Notes (Signed)
   Subjective:    Patient ID: Katie Rocha, female    DOB: [birth year], 29 y.o.   MRN: [id number]  HPI Three-month follow-up for anxiety-she was seen and switch to Lexapro. Previously on citalopram. Last gad 7 score was 21 and PHQ 9 score was 20. They had also discussed trying to start to decrease her Xanax. Has been trying to cut down and take a half. She does a lot of door checking. She feels like she may have some OCd.  Says her husband cheated on her and gave her an STD and that is what triggered a lot of this.   Though admits she has been a nervous person her whole life.   Back pain for 2 days. She was picking up laundry and "pulled a muscle". She has been taking ibuprofen and tramadol. She reports that prednisone helps with a flare up. Worse if she bends forward or if she turns just a certain way or rotates at her hips. No radiation of pain into the buttock area or down to the legs. No numbness or tingling.  Has been seeing a Veterinary surgeon at [employer]. That has been helpful as well.  Review of Systems     Objective:   Physical Exam  Constitutional: She appears well-nourished.  HENT:  Head: Normocephalic and atraumatic.  Musculoskeletal:  Normal lumbar flexion, extension, rotation right and left. Neg straigt leg raise bilaterally. Nontender of the lumbar spine or paraspinous muscles. Hip, knee and ankle strength is 5/5 bilaterally.  Patellar reflexes 2+ bilaterally.  Skin: Skin is warm and dry.  Psychiatric: She has a normal mood and affect.          Assessment & Plan:  Anxiety /acute depression-uncontrolled. Gad 7 score of 20 today and PHQ 9 score of 17. Overall she does feel like the medication has really helped her but just feels like she still has a ways to go. We discussed options. She would like to try increasing the Lexapro first before changing medications. I think this is reasonable but I don't know how much improvement will really make in her symptom control. I did  encourage her to continue seeing the counselor.  If were not seen effective results at follow-up in 4-6 weeks then will consider paxil as has worked for her Dad who has anxiety and OCD.    Low Back pain -given short course of prednisone since this does seem to alleviate her symptoms in the past. Work on gentle stretches, heating pad and if not improving over the next 2-3 weeks last no. She can restart her ibuprofen when she completes the prednisone. Make sure take with food and water.

## 2015-02-08 ENCOUNTER — Ambulatory Visit (INDEPENDENT_AMBULATORY_CARE_PROVIDER_SITE_OTHER): Payer: BLUE CROSS/BLUE SHIELD | Admitting: Family Medicine

## 2015-02-08 ENCOUNTER — Encounter: Payer: Self-pay | Admitting: Family Medicine

## 2015-02-08 VITALS — BP 131/83 | HR 81 | Wt 172.0 lb

## 2015-02-08 DIAGNOSIS — F329 Major depressive disorder, single episode, unspecified: Secondary | ICD-10-CM | POA: Diagnosis not present

## 2015-02-08 DIAGNOSIS — F32A Depression, unspecified: Secondary | ICD-10-CM

## 2015-02-08 MED ORDER — BUPROPION HCL ER (XL) 150 MG PO TB24: 150.0000 mg | ORAL_TABLET | Freq: Every day | ORAL | Status: DC

## 2015-02-08 MED ORDER — ESCITALOPRAM OXALATE 20 MG PO TABS: 20.0000 mg | ORAL_TABLET | Freq: Every day | ORAL | Status: DC

## 2015-02-08 MED ORDER — ALPRAZOLAM 0.5 MG PO TABS: 0.5000 mg | ORAL_TABLET | Freq: Every day | ORAL | Status: DC | PRN

## 2015-02-08 NOTE — Assessment & Plan Note (Signed)
Depression and anxiety not quite at goal on Lexapro. We will augment with Wellbutrin as patient is at max dose of Lexapro. Continue to wean Xanax. Return in 1-3 months.

## 2015-02-08 NOTE — Patient Instructions (Signed)
Thank you for coming in today. Continue Lexapro you're already at the max dose. Add Wellbutrin XR daily Using an ex sparingly. Attempt to wean this medicine soon. Continue therapy if he finds it helpful. Return in about 2 or 3 months for recheck Generalized Anxiety Disorder Generalized anxiety disorder (GAD) is a mental disorder. It interferes with life functions, including relationships, work, and school. GAD is different from normal anxiety, which everyone experiences at some point in their lives in response to specific life events and activities. Normal anxiety actually helps [place] prepare for and get through these life events and activities. Normal anxiety goes away after the event or activity is over.  GAD causes anxiety that is not necessarily related to specific events or activities. It also causes excess anxiety in proportion to specific events or activities. The anxiety associated with GAD is also difficult to control. GAD can vary from mild to severe. People with severe GAD can have intense waves of anxiety with physical symptoms (panic attacks).  SYMPTOMS The anxiety and worry associated with GAD are difficult to control. This anxiety and worry are related to many life events and activities and also occur more days than not for 6 months or longer. People with GAD also have three or more of the following symptoms (one or more in children):  Restlessness.   Fatigue.  Difficulty concentrating.   Irritability.  Muscle tension.  Difficulty sleeping or unsatisfying sleep. DIAGNOSIS GAD is diagnosed through an assessment by your health care provider. Your health care provider will ask you questions aboutyour mood,physical symptoms, and events in your life. Your health care provider may ask you about your medical history and use of alcohol or drugs, including prescription medicines. Your health care provider may also do a physical exam and blood tests. Certain medical conditions and the  use of certain substances can cause symptoms similar to those associated with GAD. Your health care provider may refer you to a mental health specialist for further evaluation. TREATMENT The following therapies are usually used to treat GAD:   Medication. Antidepressant medication usually is prescribed for long-term daily control. Antianxiety medicines may be added in severe cases, especially when panic attacks occur.   Talk therapy (psychotherapy). Certain types of talk therapy can be helpful in treating GAD by providing support, education, and guidance. A form of talk therapy called cognitive behavioral therapy can teach you healthy ways to think about and react to daily life events and activities.  Stress managementtechniques. These include yoga, meditation, and exercise and can be very helpful when they are practiced regularly. A mental health specialist can help determine which treatment is best for you. Some people see improvement with one therapy. However, other people require a combination of therapies. Document Released: 11/01/2012 Document Revised: 11/21/2013 Document Reviewed: 11/01/2012 [facility] Patient Information 2015 [place], [place]. This information is not intended to replace advice given to you by your health care provider. Make sure you discuss any questions you have with your health care provider.

## 2015-02-08 NOTE — Progress Notes (Signed)
Katie Rocha is a 29 y.o. female who presents to [name]  today for depression and anxiety. Patient presents to clinic today to follow-up depression and anxiety. She currently takes Lexapro 20 mg daily and Xanax once daily as needed. She has successfully weaned from 3 times a day Xanax. She feels better but not completely better. She denies any significant feelings of panic. No vomiting fevers chills nausea vomiting or diarrhea. No chest pains palpitations or shortness of breath.   Past Medical History  Diagnosis Date  . Hypertension   . History of kidney stones    Past Surgical History  Procedure Laterality Date  . Dilation and curettage of uterus    . Lithotripsy     History  Substance Use Topics  . Smoking status: Never Smoker   . Smokeless tobacco: Never Used  . Alcohol Use: Yes   ROS as above Medications: Current Outpatient Prescriptions  Medication Sig Dispense Refill  . ALPRAZolam (XANAX) 0.5 MG tablet Take 1 tablet (0.5 mg total) by mouth daily as needed for anxiety. 30 tablet 1  . escitalopram (LEXAPRO) 20 MG tablet Take 1 tablet (20 mg total) by mouth at bedtime. 30 tablet 3  . Multiple Vitamin (MULTIVITAMIN) tablet Take 1 tablet by mouth daily.    . Omega-3 Fatty Acids (FISH OIL PO) Take by mouth daily.    . valACYclovir (VALTREX) 1000 MG tablet Take 1,000 mg by mouth 2 (two) times daily.    Marland Kitchen buPROPion (WELLBUTRIN XL) 150 MG 24 hr tablet Take 1 tablet (150 mg total) by mouth daily. 30 tablet 2   No current facility-administered medications for this visit.   Allergies  Allergen Reactions  . Celexa [Citalopram Hydrobromide] Diarrhea    diarrhea     Exam:  BP 131/83 mmHg  Pulse 81  Wt 172 lb (78.019 kg) Gen: Well NAD HEENT: EOMI,  MMM no goiter Lungs: Normal work of breathing. CTABL Heart: RRR no MRG Abd: NABS, Soft. Nondistended, Nontender Exts: Brisk capillary refill, warm and well perfused.  Psych: Alert and oriented  normal affect and speech and thought process. No SI or HI. PHQ9 is 11, GAD 7 is 8  No results found for this or any previous visit (from the past 24 hour(s)). No results found.   Please see individual assessment and plan sections.

## 2015-02-18 ENCOUNTER — Emergency Department (INDEPENDENT_AMBULATORY_CARE_PROVIDER_SITE_OTHER)
Admission: EM | Admit: 2015-02-18 | Discharge: 2015-02-18 | Disposition: A | Discharge status: Home / Self Care | Payer: BLUE CROSS/BLUE SHIELD | Source: Home / Self Care | Attending: Family Medicine | Admitting: Family Medicine

## 2015-02-18 ENCOUNTER — Other Ambulatory Visit: Payer: Self-pay | Admitting: Sports Medicine

## 2015-02-18 ENCOUNTER — Emergency Department (INDEPENDENT_AMBULATORY_CARE_PROVIDER_SITE_OTHER): Payer: BLUE CROSS/BLUE SHIELD

## 2015-02-18 ENCOUNTER — Encounter: Payer: Self-pay | Admitting: Emergency Medicine

## 2015-02-18 DIAGNOSIS — M79662 Pain in left lower leg: Secondary | ICD-10-CM

## 2015-02-18 DIAGNOSIS — Y9352 Activity, horseback riding: Secondary | ICD-10-CM

## 2015-02-18 DIAGNOSIS — S82492A Other fracture of shaft of left fibula, initial encounter for closed fracture: Secondary | ICD-10-CM | POA: Diagnosis not present

## 2015-02-18 DIAGNOSIS — M2548 Effusion, other site: Secondary | ICD-10-CM | POA: Diagnosis not present

## 2015-02-18 DIAGNOSIS — S82402A Unspecified fracture of shaft of left fibula, initial encounter for closed fracture: Secondary | ICD-10-CM | POA: Diagnosis not present

## 2015-02-18 DIAGNOSIS — S82832A Other fracture of upper and lower end of left fibula, initial encounter for closed fracture: Secondary | ICD-10-CM | POA: Insufficient documentation

## 2015-02-18 DIAGNOSIS — W1789XA Other fall from one level to another, initial encounter: Secondary | ICD-10-CM

## 2015-02-18 MED ORDER — IBUPROFEN 800 MG PO TABS: 800.0000 mg | ORAL_TABLET | Freq: Three times a day (TID) | ORAL | Status: DC

## 2015-02-18 MED ORDER — OXYCODONE-ACETAMINOPHEN 5-325 MG PO TABS: 1.0000 | ORAL_TABLET | ORAL | Status: DC | PRN

## 2015-02-18 NOTE — ED Notes (Signed)
Pt c/o left knee pain from knee injury yesterday.  Pt was thrown from a horse while riding.  Cannot bare any weight on knee.

## 2015-02-18 NOTE — Discharge Instructions (Signed)
Percocet is a narcotic pain medication, do not combine these medications with others containing tylenol. While taking, do not drink alcohol, drive, or perform any other activities that requires focus while taking these medications.   Please be sure to be Non-weight bearing until you follow up with Dr. Benjamin Stain.

## 2015-02-18 NOTE — ED Provider Notes (Signed)
CSN: [id number]     Arrival date & time 02/18/15  1059 History   None    Chief Complaint  Patient presents with  . Knee Injury   (Consider location/radiation/quality/duration/timing/severity/associated sxs/prior Treatment) HPI Patient is a 29 year old female presenting to urgent care with complaints of severe left knee pain after falling off a horse yesterday.  Patient states she was bucked off horse into the air and landed on her hands and knees with majority of her weight, going onto her left knee.  Patient notes she does have an abrasion to her left knee.  Pain is achy, sore throbbing 9/10 at worst.  Unable to bear weight onto her knee.  Pain does radiate into her calf.  Patient reports intermittent numbness and tingling in her leg.  Patient states she has been using crutches to get around.  Denies prior injury to her left knee.  Denies hitting her head or loss of consciousness.  Patient also notes mild achiness to her left elbow but states she is able to move it completely without pain.  Denies any bruising or swelling to her left elbow.  No other injuries.  Past Medical History  Diagnosis Date  . Hypertension   . History of kidney stones    Past Surgical History  Procedure Laterality Date  . Dilation and curettage of uterus    . Lithotripsy     Family History  Problem Relation Age of Onset  . Hypertension Father   . Hyperlipidemia Maternal Grandmother   . Hyperlipidemia Maternal Grandfather    History  Substance Use Topics  . Smoking status: Never Smoker   . Smokeless tobacco: Never Used  . Alcohol Use: Yes   OB History    No data available     Review of Systems  Musculoskeletal: Positive for myalgias, joint swelling, arthralgias and gait problem. Negative for back pain, neck pain and neck stiffness.       Left knee and calf pain  Skin: Positive for wound. Negative for color change, pallor and rash.  Neurological: Positive for weakness ( Left leg due to pain) and  numbness.    Allergies  Celexa  Home Medications   Prior to Admission medications   Medication Sig Start Date End Date Taking? Authorizing Provider  ibuprofen (ADVIL,MOTRIN) 100 MG chewable tablet Chew by mouth every 8 (eight) hours as needed.   Yes Historical Provider, MD  ALPRAZolam Prudy Feeler) 0.5 MG tablet Take 1 tablet (0.5 mg total) by mouth daily as needed for anxiety. 02/08/15   Rodolph Bong, MD  buPROPion (WELLBUTRIN XL) 150 MG 24 hr tablet Take 1 tablet (150 mg total) by mouth daily. 02/08/15   Rodolph Bong, MD  escitalopram (LEXAPRO) 20 MG tablet Take 1 tablet (20 mg total) by mouth at bedtime. 02/08/15   Rodolph Bong, MD  ibuprofen (ADVIL,MOTRIN) 800 MG tablet Take 1 tablet (800 mg total) by mouth 3 (three) times daily. 02/18/15   Junius Finner, PA-C  Multiple Vitamin (MULTIVITAMIN) tablet Take 1 tablet by mouth daily.    Historical Provider, MD  Omega-3 Fatty Acids (FISH OIL PO) Take by mouth daily.    Historical Provider, MD  oxyCODONE-acetaminophen (PERCOCET/ROXICET) 5-325 MG per tablet Take 1-2 tablets by mouth every 4 (four) hours as needed for severe pain. 02/18/15   Junius Finner, PA-C  valACYclovir (VALTREX) 1000 MG tablet Take 1,000 mg by mouth 2 (two) times daily.    Historical Provider, MD   BP 131/85 mmHg  Pulse 80  Temp(Src) 98.4 F (36.9 C) (Oral)  Ht 5\' 4"  (1.626 m)  Wt 172 lb (78.019 kg)  BMI 29.51 kg/m2  SpO2 98%  LMP 02/15/2015 (Approximate) Physical Exam  Constitutional: She is oriented to person, place, and time. She appears well-developed and well-nourished.  HENT:  Head: Normocephalic and atraumatic.  Eyes: EOM are normal.  Neck: Normal range of motion.  Cardiovascular: Normal rate.   Pulses:      Radial pulses are 2+ on the left side.       Dorsalis pedis pulses are 2+ on the left side.       Posterior tibial pulses are 2+ on the left side.  Left foot: cap refill < 3 seconds  Pulmonary/Chest: Effort normal.  Musculoskeletal: She exhibits edema and  tenderness.  Left knee: no obvious deformity. Mild edema to Lateral aspect. Circumferential tenderness, worse in medial joint space. Slight decreased ROM with due to pain at extremes of flexion and extension. Unable to bear weight on Left leg. Left lower leg: tenderness to lateral aspect over calf. Compartment soft. Left hip and ankle: non-tender. FROM.  FROM all 5 toes on Left foot. 4/5 strength with plantarflexion and dorsiflexion.   Left elbow: no obvious deformity, mild tenderness to medial aspect. FROM, 5/5 strength. FROM Left shoulder and wrist w/o pain.  Neurological: She is alert and oriented to person, place, and time.  Sensation in tact. Patella reflex not checked due to severe pain of Left knee.  Skin: Skin is warm and dry.  Left knee, anterior aspect: superficial abrasion. No active bleeding.  Psychiatric: She has a normal mood and affect. Her behavior is normal.  Nursing note and vitals reviewed.   ED Course  Procedures (including critical care time) Labs Review Labs Reviewed - No data to display  Imaging Review Dg Tibia/fibula Left  02/18/2015   CLINICAL DATA:  Thrown from horse yesterday, now with pain and swelling involving the and lower leg with multiple abrasions.  EXAM: LEFT TIBIA AND FIBULA - 2 VIEW  COMPARISON:  Left knee radiographs-earlier same day  FINDINGS: No fracture or dislocation. Normal appearance of the ankle given obliquity and large field of view. Regional soft tissues appear normal. No radiopaque foreign body.  IMPRESSION: No fracture or radiopaque foreign body.   Electronically Signed   By: Simonne Come M.D.   On: 02/18/2015 12:00   Dg Knee Complete 4 Views Left  02/18/2015   CLINICAL DATA:  Thrown from horse yesterday. Pain and swelling entire knee.  EXAM: LEFT KNEE - COMPLETE 4+ VIEW  COMPARISON:  None.  FINDINGS: There is a fracture through the left fibular neck. There is a moderate joint effusion. No additional fracture. No subluxation or dislocation.   IMPRESSION: Left fibular neck fracture.  Moderate joint effusion.   Electronically Signed   By: Charlett Nose M.D.   On: 02/18/2015 11:59     MDM   1. Fibula fracture, left, closed, initial encounter   2. Fall from horse, initial encounter   3. Calf pain, left    Patient is a 29 year old female complaining of severe left knee and calf pain after falling from a horse yesterday.  Also complaining of mild left arm pain.  Left leg is neurovascularly intact, with increased pain on full extension and full flexion of left knee.  Tenderness to proximal lateral left lower leg. Plain films significant for left fibular neck fracture and moderate joint effusion.  Consulted with Dr. Benjamin Stain, sports medicine, who also reviewed the imaging.  Does not believe there is evidence of tibia plateau fracture.  No additional imaging indicated at this time.  No evidence of compartment syndrome.  Patient placed in a knee immobilizer.  Patient came with thrown crutches.  Advised patient to remain non-weightbearing until follow-up with Dr. Benjamin Stain in 1-2 weeks, when additional plain films will be performed.  Work note provided for patient. Rx: percocet (25 tabs) and ibuprofen  (21 tabs)  Encouraged rest, ice, and elevation.  Patient verbalized understanding and agreement with treatment plan.       Junius Finner, PA-C 02/18/15 1319

## 2015-02-21 MED ORDER — OXYCODONE-ACETAMINOPHEN 5-325 MG PO TABS: 1.0000 | ORAL_TABLET | Freq: Four times a day (QID) | ORAL | Status: DC | PRN

## 2015-02-23 ENCOUNTER — Ambulatory Visit (INDEPENDENT_AMBULATORY_CARE_PROVIDER_SITE_OTHER): Payer: BLUE CROSS/BLUE SHIELD | Admitting: Sports Medicine

## 2015-02-23 ENCOUNTER — Encounter: Payer: Self-pay | Admitting: Sports Medicine

## 2015-02-23 ENCOUNTER — Ambulatory Visit (INDEPENDENT_AMBULATORY_CARE_PROVIDER_SITE_OTHER): Payer: BLUE CROSS/BLUE SHIELD

## 2015-02-23 VITALS — BP 121/67 | HR 83 | Wt 186.0 lb

## 2015-02-23 DIAGNOSIS — S82832A Other fracture of upper and lower end of left fibula, initial encounter for closed fracture: Secondary | ICD-10-CM | POA: Diagnosis not present

## 2015-02-23 DIAGNOSIS — X58XXXD Exposure to other specified factors, subsequent encounter: Secondary | ICD-10-CM | POA: Diagnosis not present

## 2015-02-23 DIAGNOSIS — M25062 Hemarthrosis, left knee: Secondary | ICD-10-CM | POA: Diagnosis not present

## 2015-02-23 DIAGNOSIS — S72002D Fracture of unspecified part of neck of left femur, subsequent encounter for closed fracture with routine healing: Secondary | ICD-10-CM | POA: Diagnosis not present

## 2015-02-23 MED ORDER — HYDROCODONE-ACETAMINOPHEN 10-325 MG PO TABS: 1.0000 | ORAL_TABLET | ORAL | Status: DC | PRN

## 2015-02-23 NOTE — Assessment & Plan Note (Signed)
Aspiration of 36 mL of hemarthrosis, she does have some medial ligamentous laxity after the trauma. I'm going to obtain an MRI.

## 2015-02-23 NOTE — Assessment & Plan Note (Addendum)
Overall doing well, continuing immobilizer. Strap with compressive dressing. Aspiration of large hemarthrosis. Continue immobilizer for 2 more weeks, at the follow-up visit we will probably transition her to a hinged brace. Vicodin refilled. We did pull out a fairly significant hemarthrosis, considering medial knee pain and ligamentous instability I am going to also obtain an MRI  I billed a fracture code for this encounter, all subsequent visits will be post-op checks in the global period.

## 2015-02-23 NOTE — Progress Notes (Signed)
   Subjective:    I'm seeing this patient as a consultation for:  Junius Finner PA-C  CC: Left knee injury  HPI: Approximately 6 days ago this pleasant 29 year old female was stepped on by a horse, she had immediate pain, swelling, over both the lateral and medial aspect of her knee, she was seen in urgent care, placed in the knee immobilizer, x-ray showed a fracture of the proximal fibula, she did have some numbness and paresthesias over the anterolateral lower leg at that time. Overall the paresthesias have improved however are still minimally present. The pain over her lateral knee is minimal when compared to the pain over the medial knee. Severe, persistent.  Past medical history, Surgical history, Family history not pertinant except as noted below, Social history, Allergies, and medications have been entered into the medical record, reviewed, and no changes needed.   Review of Systems: No headache, visual changes, nausea, vomiting, diarrhea, constipation, dizziness, abdominal pain, skin rash, fevers, chills, night sweats, weight loss, swollen lymph nodes, body aches, joint swelling, muscle aches, chest pain, shortness of breath, mood changes, visual or auditory hallucinations.   Objective:   General: Well Developed, well nourished, and in no acute distress.  Neuro/Psych: Alert and oriented x3, extra-ocular muscles intact, able to move all 4 extremities, sensation grossly intact. Skin: Warm and dry, no rashes noted.  Respiratory: Not using accessory muscles, speaking in full sentences, trachea midline.  Cardiovascular: Pulses palpable, no extremity edema. Abdomen: Does not appear distended. Left knee: Visibly swollen with palpable effusion and fluid wave, tender to palpation over the fibular fracture, she also has significant tenderness to palpation over the medial joint line, MCL, with possible valgus laxity, I am unable to examine her anterior cruciate ligament or posterior cruciate  ligament due to guarding and pain. She does have good strength to dorsiflexion and eversion of her ankle, and sensation is intact over the common peroneal nerve distribution.  Procedure: Real-time Ultrasound Guided aspiration/Injection of left knee Device: GE Logiq E  Verbal informed consent obtained.  Time-out conducted.  Noted no overlying erythema, induration, or other signs of local infection.  Skin prepped in a sterile fashion.  Local anesthesia: Topical Ethyl chloride.  With sterile technique and under real time ultrasound guidance:  Aspirated 36 mL of frank blood, syringe switched and 3 mL lidocaine, 3 mL Marcaine injected easily Completed without difficulty  Pain immediately resolved suggesting accurate placement of the medication.  Advised to call if fevers/chills, erythema, induration, drainage, or persistent bleeding.  Images permanently stored and available for review in the ultrasound unit.  Impression: Technically successful ultrasound guided injection.  X-rays reviewed and show no appreciable change in the proximal fibular fracture  Impression and Recommendations:   This case required medical decision making of moderate complexity.

## 2015-02-26 ENCOUNTER — Ambulatory Visit (INDEPENDENT_AMBULATORY_CARE_PROVIDER_SITE_OTHER): Payer: BLUE CROSS/BLUE SHIELD

## 2015-02-26 DIAGNOSIS — S8332XD Tear of articular cartilage of left knee, current, subsequent encounter: Secondary | ICD-10-CM | POA: Diagnosis not present

## 2015-02-26 DIAGNOSIS — S72432D Displaced fracture of medial condyle of left femur, subsequent encounter for closed fracture with routine healing: Secondary | ICD-10-CM

## 2015-02-26 DIAGNOSIS — S82435D Nondisplaced oblique fracture of shaft of left fibula, subsequent encounter for closed fracture with routine healing: Secondary | ICD-10-CM

## 2015-02-26 DIAGNOSIS — S83412D Sprain of medial collateral ligament of left knee, subsequent encounter: Secondary | ICD-10-CM | POA: Diagnosis not present

## 2015-02-26 DIAGNOSIS — Y9352 Activity, horseback riding: Secondary | ICD-10-CM

## 2015-02-26 DIAGNOSIS — S83402D Sprain of unspecified collateral ligament of left knee, subsequent encounter: Secondary | ICD-10-CM

## 2015-02-26 DIAGNOSIS — M25062 Hemarthrosis, left knee: Secondary | ICD-10-CM

## 2015-02-27 ENCOUNTER — Other Ambulatory Visit: Payer: Self-pay | Admitting: Sports Medicine

## 2015-02-27 MED ORDER — OXYCODONE-ACETAMINOPHEN 10-325 MG PO TABS: 1.0000 | ORAL_TABLET | Freq: Three times a day (TID) | ORAL | Status: DC | PRN

## 2015-02-27 NOTE — Assessment & Plan Note (Signed)
Injury was more severe than previously thought after aspiration of 36 mL of hemarthrosis, she did have some ligaments laxity on exam, MRI shows posterior tibial plateau fracture, fibular head fracture, complete tear of the anterior cruciate ligament, as well as tear of the MCL. Referral to Dr. Jerl Santos consideration of surgical intervention after healing of the bones.

## 2015-03-12 ENCOUNTER — Ambulatory Visit (INDEPENDENT_AMBULATORY_CARE_PROVIDER_SITE_OTHER): Payer: BLUE CROSS/BLUE SHIELD | Admitting: Sports Medicine

## 2015-03-12 ENCOUNTER — Ambulatory Visit (INDEPENDENT_AMBULATORY_CARE_PROVIDER_SITE_OTHER): Payer: BLUE CROSS/BLUE SHIELD

## 2015-03-12 ENCOUNTER — Encounter: Payer: Self-pay | Admitting: Sports Medicine

## 2015-03-12 VITALS — BP 120/68 | HR 77 | Ht 64.0 in | Wt 177.0 lb

## 2015-03-12 DIAGNOSIS — S99912A Unspecified injury of left ankle, initial encounter: Secondary | ICD-10-CM

## 2015-03-12 DIAGNOSIS — S82492D Other fracture of shaft of left fibula, subsequent encounter for closed fracture with routine healing: Secondary | ICD-10-CM | POA: Diagnosis not present

## 2015-03-12 DIAGNOSIS — X58XXXA Exposure to other specified factors, initial encounter: Secondary | ICD-10-CM

## 2015-03-12 DIAGNOSIS — X58XXXD Exposure to other specified factors, subsequent encounter: Secondary | ICD-10-CM

## 2015-03-12 DIAGNOSIS — S82832A Other fracture of upper and lower end of left fibula, initial encounter for closed fracture: Secondary | ICD-10-CM

## 2015-03-12 DIAGNOSIS — M25062 Hemarthrosis, left knee: Secondary | ICD-10-CM

## 2015-03-12 DIAGNOSIS — S9002XA Contusion of left ankle, initial encounter: Secondary | ICD-10-CM | POA: Diagnosis not present

## 2015-03-12 DIAGNOSIS — M25572 Pain in left ankle and joints of left foot: Secondary | ICD-10-CM

## 2015-03-12 MED ORDER — HYDROMORPHONE HCL 4 MG PO TABS: ORAL_TABLET | ORAL | Status: DC

## 2015-03-12 NOTE — Assessment & Plan Note (Signed)
Follow-up of left knee polytrauma. Posterior tibial plateau impaction fracture, fibular neck fracture, complete tear of the anterior cruciate ligament, and partial tears of the LCL and MCL, she does have an appointment in 2 days for surgical consultation. I'm going to increase her pain medication to hydromorphone. We are going to switch her knee brace to a hinged brace, she is not tolerating the immobilizer.

## 2015-03-12 NOTE — Assessment & Plan Note (Signed)
Pain along the deltoid ligament and the navicular. X-rays, considering the degree of injury to her knee, we are going to get also an MRI of her ankle.

## 2015-03-12 NOTE — Progress Notes (Signed)
  Subjective:    CC: Follow-up  HPI: Left knee injury: MRI showed extensive injuries including complete tear of the anterior cruciate ligament, tears of the LCL, MCL, posterior tibial impaction fracture, and fibular neck fracture. She is having difficulty tolerating her knee immobilizer, and she does have an appointment with orthopedic surgery coming up on Wednesday.  Left knee pain: With bruising, swelling, tender over the navicular and the deltoid ligament, moderate, persistent.  Past medical history, Surgical history, Family history not pertinant except as noted below, Social history, Allergies, and medications have been entered into the medical record, reviewed, and no changes needed.   Review of Systems: No fevers, chills, night sweats, weight loss, chest pain, or shortness of breath.   Objective:    General: Well Developed, well nourished, and in no acute distress.  Neuro: Alert and oriented x3, extra-ocular muscles intact, sensation grossly intact.  HEENT: Normocephalic, atraumatic, pupils equal round reactive to light, neck supple, no masses, no lymphadenopathy, thyroid nonpalpable.  Skin: Warm and dry, no rashes. Cardiac: Regular rate and rhythm, no murmurs rubs or gallops, no lower extremity edema.  Respiratory: Clear to auscultation bilaterally. Not using accessory muscles, speaking in full sentences. Left Ankle: Visibly swollen and bruised Range of motion is full in all directions. Strength is 5/5 in all directions. Lateral ligaments feel torn, medial ligament also feels loose with a positive talar tilt, there is also significant tenderness over the navicular prominence. No pain at base of 5th MT; No tenderness over cuboid; No tenderness on posterior aspects of lateral and medial malleolus No sign of peroneal tendon subluxations; Negative tarsal tunnel tinel's Able to walk 4 steps.   Ankle x-rays are overall unremarkable  Impression and Recommendations:

## 2015-03-14 ENCOUNTER — Encounter: Payer: Self-pay | Admitting: Sports Medicine

## 2015-03-15 ENCOUNTER — Ambulatory Visit: Payer: BLUE CROSS/BLUE SHIELD

## 2015-05-20 ENCOUNTER — Other Ambulatory Visit: Payer: Self-pay | Admitting: Family Medicine

## 2015-06-25 ENCOUNTER — Encounter: Payer: Self-pay | Admitting: Family Medicine

## 2015-06-25 ENCOUNTER — Ambulatory Visit (INDEPENDENT_AMBULATORY_CARE_PROVIDER_SITE_OTHER): Payer: BLUE CROSS/BLUE SHIELD | Admitting: Family Medicine

## 2015-06-25 VITALS — BP 139/70 | HR 86 | Wt 187.0 lb

## 2015-06-25 DIAGNOSIS — F329 Major depressive disorder, single episode, unspecified: Secondary | ICD-10-CM

## 2015-06-25 DIAGNOSIS — F32A Depression, unspecified: Secondary | ICD-10-CM

## 2015-06-25 MED ORDER — BUPROPION HCL ER (XL) 150 MG PO TB24: 150.0000 mg | ORAL_TABLET | Freq: Every day | ORAL | Status: AC

## 2015-06-25 MED ORDER — ESCITALOPRAM OXALATE 20 MG PO TABS: 20.0000 mg | ORAL_TABLET | Freq: Every day | ORAL | Status: AC

## 2015-06-25 NOTE — Patient Instructions (Signed)
Thank you for coming in today. Return with Katie Rocha in 6-12 months.

## 2015-06-25 NOTE — Assessment & Plan Note (Signed)
Much better controlled with Wellbutrin and Lexapro. Follow-up with PCP. Medications refilled

## 2015-06-25 NOTE — Progress Notes (Signed)
Katie Rocha is a 29 y.o. female [facility] for follow-up depression and anxiety. Patient was seen in July. She was started on Wellbutrin. Since then she's done fantastically well. She would like refill of both Wellbutrin and Lexapro. She scheduled an appointment with myself the center of her PCP is I saw her originally well Ms. Katie Rocha was out on maternity leave. She wishes to keep her current primary care provider.   Past Medical History  Diagnosis Date  . Hypertension   . History of kidney stones    Past Surgical History  Procedure Laterality Date  . Dilation and curettage of uterus    . Lithotripsy     Social History  Substance Use Topics  . Smoking status: Never Smoker   . Smokeless tobacco: Never Used  . Alcohol Use: Yes   family history includes Hyperlipidemia in her maternal grandfather and maternal grandmother; Hypertension in her father.  ROS as above Medications: Current Outpatient Prescriptions  Medication Sig Dispense Refill  . buPROPion (WELLBUTRIN XL) 150 MG 24 hr tablet Take 1 tablet (150 mg total) by mouth daily. 90 tablet 2  . escitalopram (LEXAPRO) 20 MG tablet Take 1 tablet (20 mg total) by mouth at bedtime. 90 tablet 2  . Multiple Vitamin (MULTIVITAMIN) tablet Take 1 tablet by mouth daily.     No current facility-administered medications for this visit.   Allergies  Allergen Reactions  . Celexa [Citalopram Hydrobromide] Diarrhea    diarrhea     Exam:  BP 139/70 mmHg  Pulse 86  Wt 187 lb (84.823 kg) Gen: Well NAD Psych: Alert and oriented normal affect speech and thought process. No SI or HI.  No results found for this or any previous visit (from the past 24 hour(s)). No results found.   Please see individual assessment and plan sections.
